# Patient Record
Sex: Female | Born: 1960 | Race: White | Hispanic: No | Smoking: Never smoker
Health system: Southern US, Community
[De-identification: ages and names within clinical notes are randomized; demographics above are authoritative.]

---

## 1999-01-29 ENCOUNTER — Other Ambulatory Visit: Admission: RE | Admit: 1999-01-29 | Discharge: 1999-01-29 | Payer: Self-pay | Admitting: Obstetrics and Gynecology

## 1999-02-22 ENCOUNTER — Other Ambulatory Visit: Admission: RE | Admit: 1999-02-22 | Discharge: 1999-02-22 | Payer: Self-pay | Admitting: Obstetrics and Gynecology

## 2000-01-31 ENCOUNTER — Other Ambulatory Visit: Admission: RE | Admit: 2000-01-31 | Discharge: 2000-01-31 | Payer: Self-pay | Admitting: Obstetrics and Gynecology

## 2004-01-24 ENCOUNTER — Other Ambulatory Visit: Admission: RE | Admit: 2004-01-24 | Discharge: 2004-01-24 | Payer: Self-pay | Admitting: Obstetrics and Gynecology

## 2004-01-24 ENCOUNTER — Encounter: Admission: RE | Admit: 2004-01-24 | Discharge: 2004-01-24 | Payer: Self-pay | Admitting: Obstetrics and Gynecology

## 2005-01-28 ENCOUNTER — Other Ambulatory Visit: Admission: RE | Admit: 2005-01-28 | Discharge: 2005-01-28 | Payer: Self-pay | Admitting: Obstetrics and Gynecology

## 2006-01-06 ENCOUNTER — Other Ambulatory Visit: Admission: RE | Admit: 2006-01-06 | Discharge: 2006-01-06 | Payer: Self-pay | Admitting: Family Medicine

## 2007-02-02 ENCOUNTER — Other Ambulatory Visit: Admission: RE | Admit: 2007-02-02 | Discharge: 2007-02-02 | Payer: Self-pay | Admitting: Family Medicine

## 2010-12-01 HISTORY — PX: BREAST BIOPSY: SHX20

## 2011-08-12 ENCOUNTER — Other Ambulatory Visit: Payer: Self-pay | Admitting: Obstetrics and Gynecology

## 2011-08-12 ENCOUNTER — Other Ambulatory Visit (HOSPITAL_COMMUNITY)
Admission: RE | Admit: 2011-08-12 | Discharge: 2011-08-12 | Disposition: A | Discharge status: Home / Self Care | Payer: PRIVATE HEALTH INSURANCE | Source: Ambulatory Visit | Attending: Obstetrics and Gynecology | Admitting: Obstetrics and Gynecology

## 2011-08-12 DIAGNOSIS — Z01419 Encounter for gynecological examination (general) (routine) without abnormal findings: Secondary | ICD-10-CM | POA: Insufficient documentation

## 2011-08-12 DIAGNOSIS — Z1231 Encounter for screening mammogram for malignant neoplasm of breast: Secondary | ICD-10-CM

## 2011-08-14 ENCOUNTER — Ambulatory Visit
Admission: RE | Admit: 2011-08-14 | Discharge: 2011-08-14 | Disposition: A | Discharge status: Home / Self Care | Payer: PRIVATE HEALTH INSURANCE | Source: Ambulatory Visit | Attending: Obstetrics and Gynecology | Admitting: Obstetrics and Gynecology

## 2011-08-14 DIAGNOSIS — Z1231 Encounter for screening mammogram for malignant neoplasm of breast: Secondary | ICD-10-CM

## 2011-08-20 ENCOUNTER — Other Ambulatory Visit: Payer: Self-pay | Admitting: Obstetrics and Gynecology

## 2011-08-20 DIAGNOSIS — R928 Other abnormal and inconclusive findings on diagnostic imaging of breast: Secondary | ICD-10-CM

## 2011-08-28 ENCOUNTER — Other Ambulatory Visit: Payer: Self-pay | Admitting: Obstetrics and Gynecology

## 2011-08-28 ENCOUNTER — Ambulatory Visit
Admission: RE | Admit: 2011-08-28 | Discharge: 2011-08-28 | Disposition: A | Discharge status: Home / Self Care | Payer: PRIVATE HEALTH INSURANCE | Source: Ambulatory Visit | Attending: Obstetrics and Gynecology | Admitting: Obstetrics and Gynecology

## 2011-08-28 DIAGNOSIS — R928 Other abnormal and inconclusive findings on diagnostic imaging of breast: Secondary | ICD-10-CM

## 2011-09-02 ENCOUNTER — Ambulatory Visit
Admission: RE | Admit: 2011-09-02 | Discharge: 2011-09-02 | Disposition: A | Discharge status: Home / Self Care | Payer: PRIVATE HEALTH INSURANCE | Source: Ambulatory Visit | Attending: Obstetrics and Gynecology | Admitting: Obstetrics and Gynecology

## 2011-09-02 ENCOUNTER — Other Ambulatory Visit: Payer: Self-pay | Admitting: Obstetrics and Gynecology

## 2011-09-02 DIAGNOSIS — R928 Other abnormal and inconclusive findings on diagnostic imaging of breast: Secondary | ICD-10-CM

## 2011-09-02 DIAGNOSIS — R921 Mammographic calcification found on diagnostic imaging of breast: Secondary | ICD-10-CM

## 2011-09-04 ENCOUNTER — Ambulatory Visit
Admission: RE | Admit: 2011-09-04 | Discharge: 2011-09-04 | Disposition: A | Discharge status: Home / Self Care | Payer: PRIVATE HEALTH INSURANCE | Source: Ambulatory Visit | Attending: Obstetrics and Gynecology | Admitting: Obstetrics and Gynecology

## 2011-09-04 DIAGNOSIS — R921 Mammographic calcification found on diagnostic imaging of breast: Secondary | ICD-10-CM

## 2012-04-04 IMAGING — MG MM BREAST STEREO BIOPSY*L*
3 series · 3 of 3 positions shown · non-contrast
Comparison: none

CLINICAL DATA: Left breast calcifications

[L CC (1 of 2)]
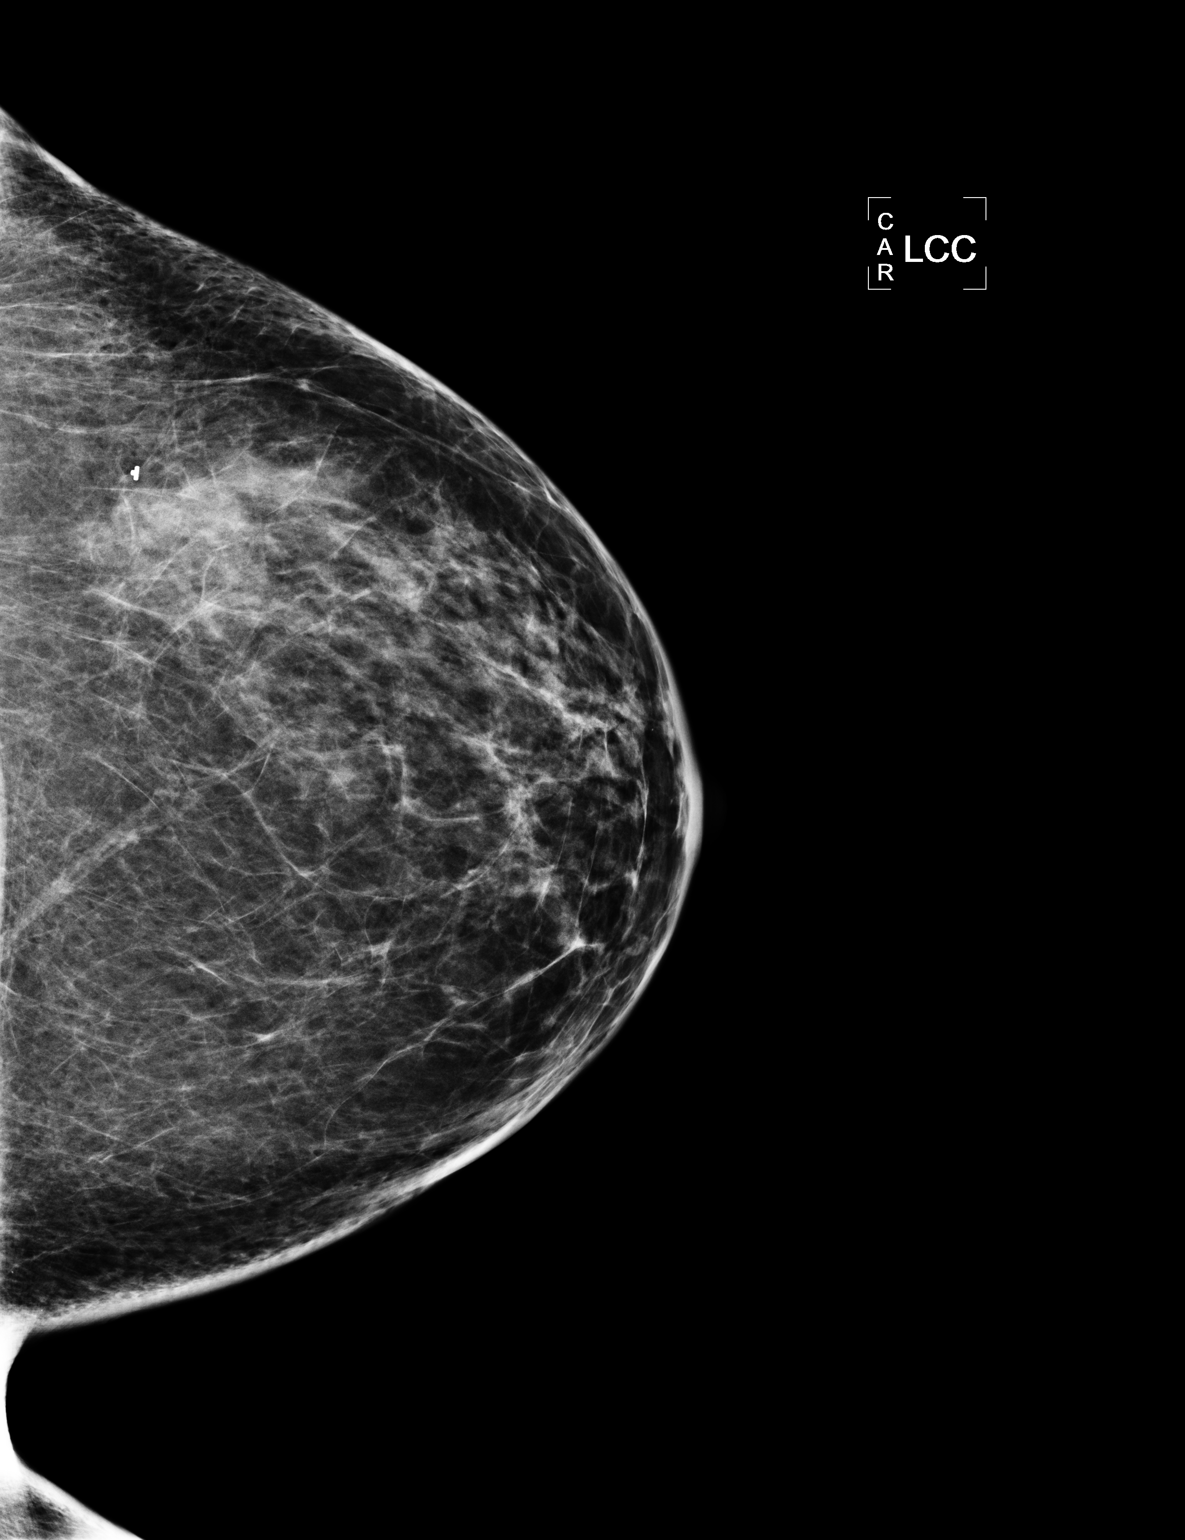

[L CC (2 of 2)]
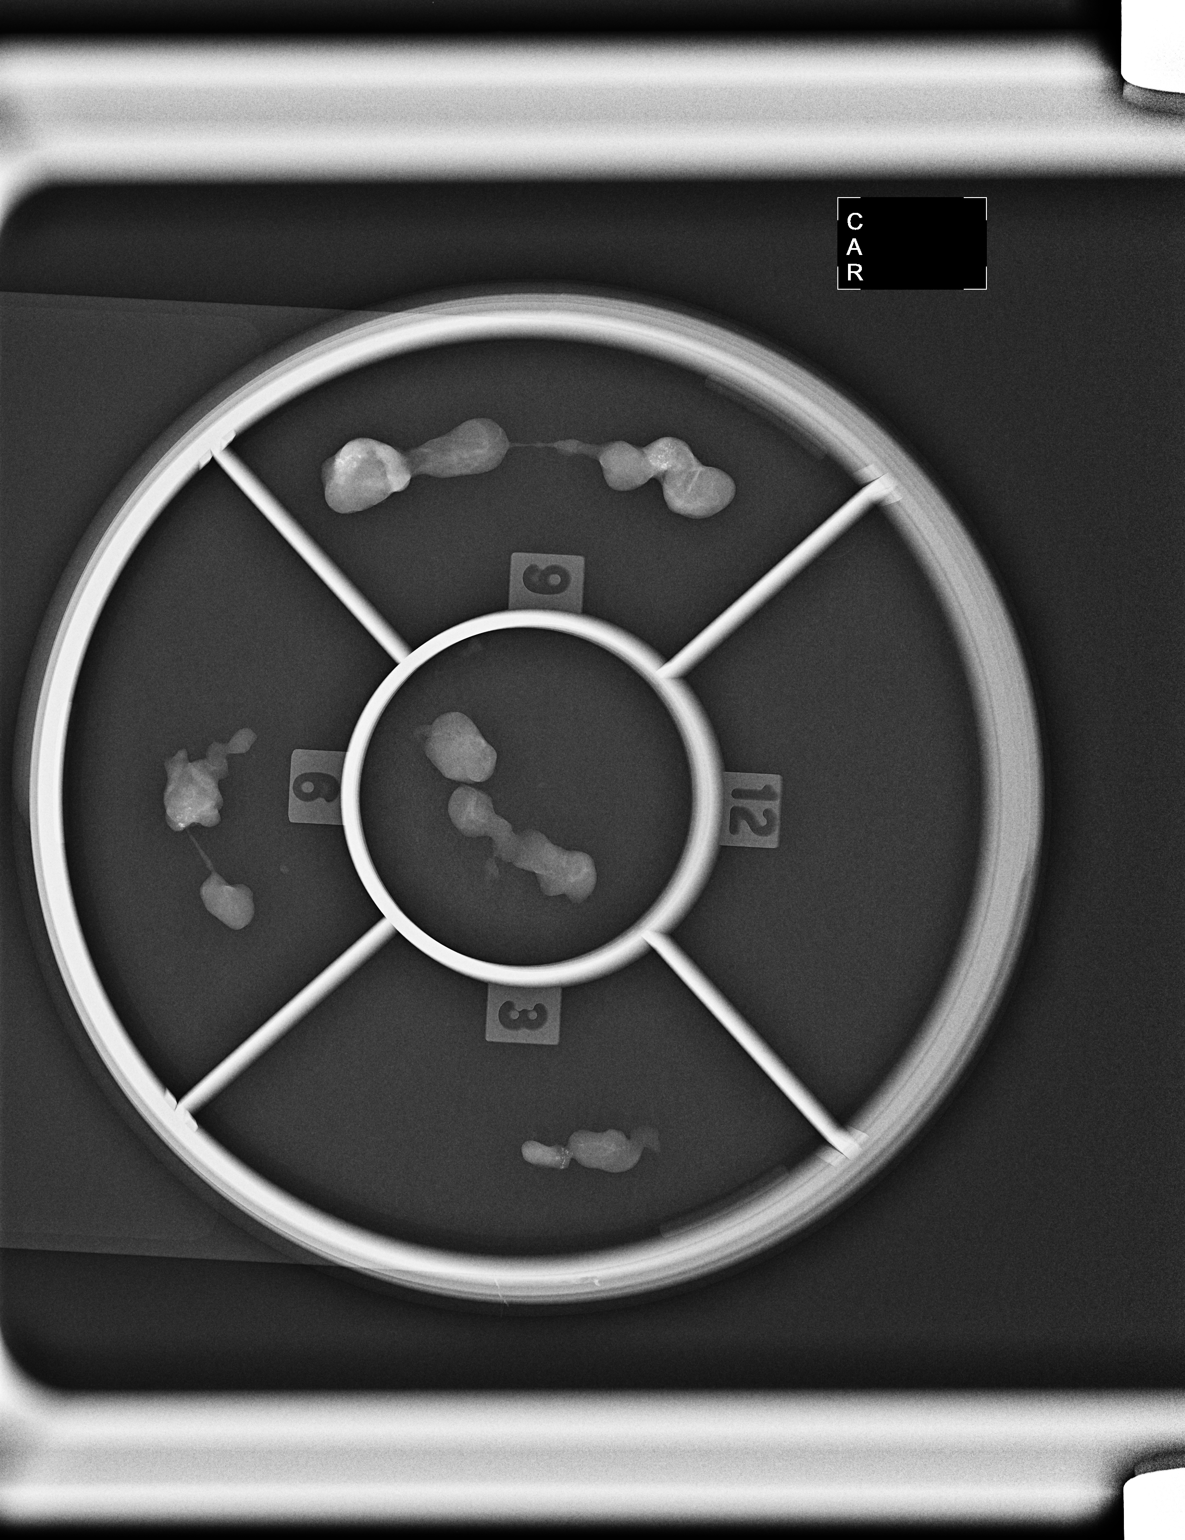

[L ML]
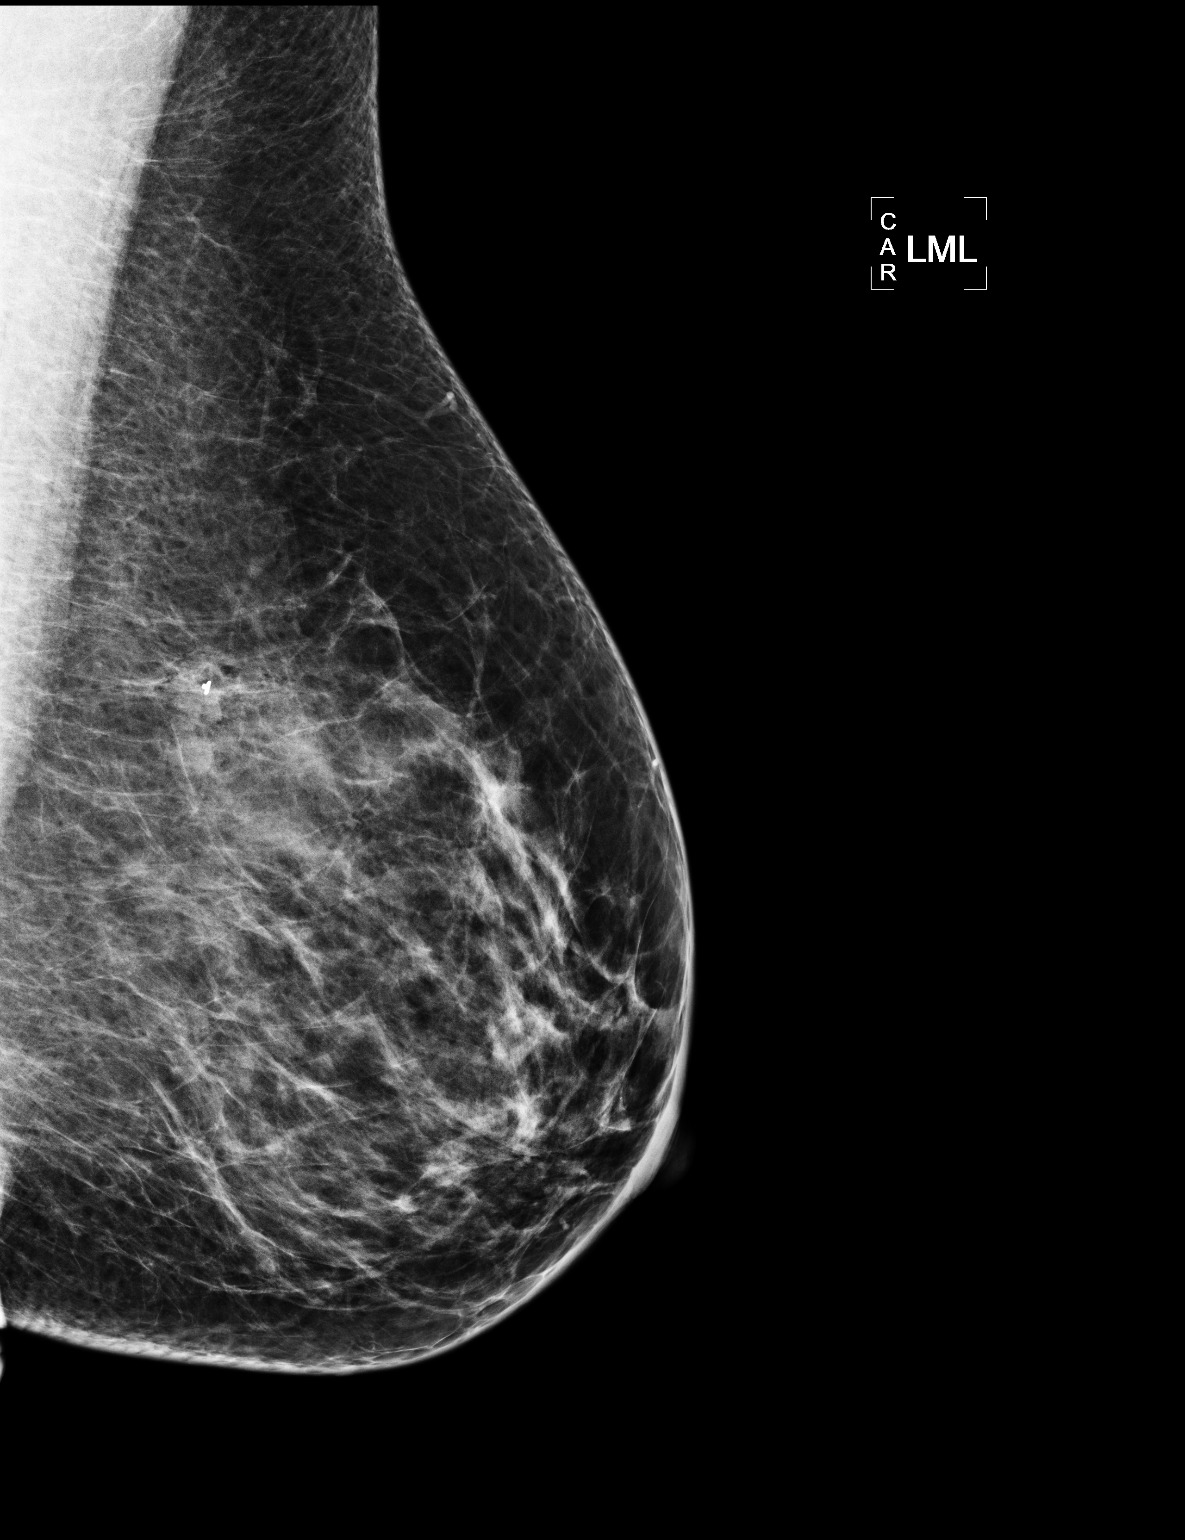

[3 of 3 positions shown; findings below may reference images not displayed]

STEREOTACTIC-GUIDED VACUUM ASSISTED BIOPSY OF THE LEFT BREAST AND
SPECIMEN RADIOGRAPH

I met with the patient, and we discussed the procedure of
stereotactic-guided biopsy, including risks, benefits, and
alternatives.  Specifically, we discussed the risks of infection,
bleeding, tissue injury, clip migration, and inadequate sampling.
Informed, written consent was given.

Using sterile technique, 2% lidocaine, stereotactic guidance, and a
9 gauge vacuum assisted device, biopsy was performed of the
calcifications.  Specimen radiograph was performed, showing
adequate calcifications present in the sample.  Specimens with
calcifications are identified for pathology.

At the conclusion of the procedure, a tissue marker clip was
deployed into the biopsy cavity.  Follow-up 2-view mammogram
confirmed clip placement in the upper-outer quadrant of the left
breast, posteriorly.  Minimal residual calcifications are
identified0.
IMPRESSION: Stereotactic-guided biopsy of calcifications, left breast.  No
apparent complications.

## 2014-10-09 ENCOUNTER — Other Ambulatory Visit: Payer: Self-pay | Admitting: Adult Health Nurse Practitioner

## 2014-10-09 DIAGNOSIS — Z1231 Encounter for screening mammogram for malignant neoplasm of breast: Secondary | ICD-10-CM

## 2014-10-12 ENCOUNTER — Encounter (INDEPENDENT_AMBULATORY_CARE_PROVIDER_SITE_OTHER): Payer: Self-pay | Admitting: *Deleted

## 2014-11-01 ENCOUNTER — Ambulatory Visit
Admission: RE | Admit: 2014-11-01 | Discharge: 2014-11-01 | Disposition: A | Discharge status: Home / Self Care | Payer: 59 | Source: Ambulatory Visit | Attending: Adult Health Nurse Practitioner | Admitting: Adult Health Nurse Practitioner

## 2014-11-01 ENCOUNTER — Encounter (INDEPENDENT_AMBULATORY_CARE_PROVIDER_SITE_OTHER): Payer: Self-pay

## 2014-11-01 DIAGNOSIS — Z1231 Encounter for screening mammogram for malignant neoplasm of breast: Secondary | ICD-10-CM

## 2014-11-02 ENCOUNTER — Encounter (INDEPENDENT_AMBULATORY_CARE_PROVIDER_SITE_OTHER): Payer: Self-pay | Admitting: *Deleted

## 2015-11-01 ENCOUNTER — Other Ambulatory Visit: Payer: Self-pay | Admitting: Adult Health Nurse Practitioner

## 2015-11-01 DIAGNOSIS — Z1231 Encounter for screening mammogram for malignant neoplasm of breast: Secondary | ICD-10-CM

## 2015-11-06 ENCOUNTER — Ambulatory Visit
Admission: RE | Admit: 2015-11-06 | Discharge: 2015-11-06 | Disposition: A | Discharge status: Home / Self Care | Payer: 59 | Source: Ambulatory Visit | Attending: Adult Health Nurse Practitioner | Admitting: Adult Health Nurse Practitioner

## 2015-11-06 DIAGNOSIS — Z1231 Encounter for screening mammogram for malignant neoplasm of breast: Secondary | ICD-10-CM

## 2015-11-08 ENCOUNTER — Ambulatory Visit: Payer: 59

## 2016-01-02 ENCOUNTER — Encounter: Payer: 59 | Admitting: Internal Medicine

## 2016-02-05 ENCOUNTER — Encounter: Payer: Self-pay | Admitting: Adult Health Nurse Practitioner

## 2017-08-31 ENCOUNTER — Other Ambulatory Visit: Payer: Self-pay | Admitting: Family Medicine

## 2017-08-31 DIAGNOSIS — Z1231 Encounter for screening mammogram for malignant neoplasm of breast: Secondary | ICD-10-CM

## 2017-09-09 ENCOUNTER — Ambulatory Visit
Admission: RE | Admit: 2017-09-09 | Discharge: 2017-09-09 | Disposition: A | Discharge status: Home / Self Care | Payer: BLUE CROSS/BLUE SHIELD | Source: Ambulatory Visit | Attending: Family Medicine | Admitting: Family Medicine

## 2017-09-09 DIAGNOSIS — Z1231 Encounter for screening mammogram for malignant neoplasm of breast: Secondary | ICD-10-CM

## 2017-10-06 ENCOUNTER — Encounter: Payer: Self-pay | Admitting: Adult Health Nurse Practitioner

## 2017-12-31 ENCOUNTER — Emergency Department (HOSPITAL_COMMUNITY): Payer: Worker's Compensation

## 2017-12-31 ENCOUNTER — Other Ambulatory Visit: Payer: Self-pay

## 2017-12-31 ENCOUNTER — Encounter (HOSPITAL_COMMUNITY): Payer: Self-pay | Admitting: *Deleted

## 2017-12-31 ENCOUNTER — Emergency Department (HOSPITAL_COMMUNITY)
Admission: EM | Admit: 2017-12-31 | Discharge: 2017-12-31 | Disposition: A | Discharge status: Home / Self Care | Payer: Worker's Compensation | Attending: Emergency Medicine | Admitting: Emergency Medicine

## 2017-12-31 DIAGNOSIS — Y9289 Other specified places as the place of occurrence of the external cause: Secondary | ICD-10-CM | POA: Insufficient documentation

## 2017-12-31 DIAGNOSIS — W01118A Fall on same level from slipping, tripping and stumbling with subsequent striking against other sharp object, initial encounter: Secondary | ICD-10-CM | POA: Insufficient documentation

## 2017-12-31 DIAGNOSIS — S60552A Superficial foreign body of left hand, initial encounter: Secondary | ICD-10-CM

## 2017-12-31 DIAGNOSIS — Y990 Civilian activity done for income or pay: Secondary | ICD-10-CM | POA: Diagnosis not present

## 2017-12-31 DIAGNOSIS — S6992XA Unspecified injury of left wrist, hand and finger(s), initial encounter: Secondary | ICD-10-CM | POA: Diagnosis present

## 2017-12-31 DIAGNOSIS — Y9301 Activity, walking, marching and hiking: Secondary | ICD-10-CM | POA: Insufficient documentation

## 2017-12-31 DIAGNOSIS — Z23 Encounter for immunization: Secondary | ICD-10-CM | POA: Diagnosis not present

## 2017-12-31 MED ORDER — TETANUS-DIPHTH-ACELL PERTUSSIS 5-2.5-18.5 LF-MCG/0.5 IM SUSP
0.5000 mL | Freq: Once | INTRAMUSCULAR | Status: AC
  Administered 2017-12-31: 0.5 mL via INTRAMUSCULAR
  Filled 2017-12-31: qty 0.5

## 2017-12-31 MED ORDER — LIDOCAINE HCL (PF) 1 % IJ SOLN
INTRAMUSCULAR | Status: AC
  Filled 2017-12-31: qty 5

## 2017-12-31 MED ORDER — LIDOCAINE HCL (PF) 1 % IJ SOLN
2.0000 mL | Freq: Once | INTRAMUSCULAR | Status: AC
  Administered 2017-12-31: 2 mL

## 2017-12-31 NOTE — ED Notes (Signed)
Wound care provided; abx ointment applied and covered with Janese BanksKerlex

## 2017-12-31 NOTE — ED Triage Notes (Addendum)
Pt was working, fell, injuring L hand. Splinter noted to be pentrating to L hand. Reports last tetanus >5 years, and is a workman's comp injury

## 2017-12-31 NOTE — ED Notes (Signed)
ED Provider at bedside. 

## 2017-12-31 NOTE — ED Provider Notes (Signed)
[facility]   CSN: [id number] Arrival date & time: 12/31/17  1952     History   Chief Complaint No chief complaint on file.   HPI Deanna Novak is a 57 y.o. female.  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 Protruding splinter noted to palmer surface of left hand at base of thumb. Minimal bleeding.    The history is provided by the patient. No language interpreter was used.  Hand Injury   The incident occurred 1 to 2 hours ago. The incident occurred at work. The injury mechanism was a fall. The pain is present in the right hand. The quality of the pain is described as throbbing. The pain is moderate. The pain has been constant since the incident. Possible foreign bodies include wood.    History reviewed. No pertinent past medical history.  There are no active problems to display for this patient.   History reviewed. No pertinent surgical history.  OB History    No data available       Home Medications    Prior to Admission medications   Not on File    Family History Family History  Problem Relation Age of Onset  . Breast cancer Neg Hx     Social History Social History   Tobacco Use  . Smoking status: Never Smoker  . Smokeless tobacco: Never Used  Substance Use Topics  . Alcohol use: Yes    Frequency: Never  . Drug use: No     Allergies   Patient has no known allergies.   Review of Systems Review of Systems  Skin: Positive for wound.  All other systems reviewed and are negative.    Physical Exam Updated Vital Signs BP (!) 150/88   Pulse 68   Temp 97.6 F (36.4 C)   Resp 16   SpO2 100%   Physical Exam  Constitutional: She is oriented to person, place, and time. She appears well-developed and well-nourished.  Eyes: Conjunctivae are normal.  Neck: Neck supple.  Cardiovascular: Normal rate and regular rhythm.  Pulmonary/Chest:  Effort normal and breath sounds normal.  Abdominal: Soft. Bowel sounds are normal.  Musculoskeletal: She exhibits tenderness.       Hands: Approximately 4 cm wooden splinter embedded in the soft tissue at the base of the left thumb in a vertical orientation from the lateral aspect and pointing to the middle finger. Entrance and exit wounds are approximately 0.3 cm apart.  Neurological: She is alert and oriented to person, place, and time.  Skin: Skin is warm and dry.  Psychiatric: She has a normal mood and affect.  Nursing note and vitals reviewed.    ED Treatments / Results  Labs (all labs ordered are listed, but only abnormal results are displayed) Labs Reviewed - No data to display  EKG  EKG Interpretation None       Radiology No results found.  Procedures .Foreign Body Removal Date/Time: 12/31/2017 9:15 PM Performed by: Felicie MornSmith, Mykia Holton, NP Authorized by: Felicie MornSmith, Annison Birchard, NP  Consent: Verbal consent obtained. Risks and benefits: risks, benefits and alternatives were discussed Consent given by: patient Patient understanding: patient states understanding of the procedure being performed Imaging studies: imaging studies available Patient identity confirmed: verbally with patient and arm band Time out: Immediately prior to procedure a "time out" was called to verify the correct patient, procedure, equipment, support staff and site/side marked as required. Body area: skin  General location: upper extremity Location details: left hand Anesthesia: local infiltration  Anesthesia: Local Anesthetic: lidocaine 1% without epinephrine Anesthetic total: 2 mL Localization method: visualized Removal mechanism: hemostat and irrigation Dressing: antibiotic ointment and dressing applied Tendon involvement: none Depth: subcutaneous Complexity: simple 1 objects recovered. Objects recovered: wooden splinter Post-procedure assessment: foreign body removed Patient tolerance: Patient  tolerated the procedure well with no immediate complications Comments: Foreign body removed from palmer surface of left hand at base of thumb. Cavity Irrigated copiously with normal saline.    (including critical care time)  Medications Ordered in ED Medications  Tdap (BOOSTRIX) injection 0.5 mL (0.5 mLs Intramuscular Given 12/31/17 2022)  lidocaine (PF) (XYLOCAINE) 1 % injection 2 mL (2 mLs Infiltration Given by Other 12/31/17 2021)     Initial Impression / Assessment and Plan / ED Course  I have reviewed the triage vital signs and the nursing notes.  Pertinent labs & imaging results that were available during my care of the patient were reviewed by me and considered in my medical decision making (see chart for details).     Embedded foreign body (wooden splinter) in the soft tissue of the palmer surface of the left hand at the base of the thumb. Foreign body removed, wound track irrigated copiously. Patient counseled to monitor for any signs of infection. Care instructions and return precautions discussed. Patient appears safe for discharge at this time.  Final Clinical Impressions(s) / ED Diagnoses   Final diagnoses:  Foreign body of left hand, initial encounter    ED Discharge Orders    None       Felicie Morn, NP 01/01/18 0011    Tegeler, Canary Brim, MD 01/01/18 [phone]

## 2018-09-07 ENCOUNTER — Other Ambulatory Visit: Payer: Self-pay | Admitting: Family Medicine

## 2018-09-07 DIAGNOSIS — Z1231 Encounter for screening mammogram for malignant neoplasm of breast: Secondary | ICD-10-CM

## 2018-10-06 ENCOUNTER — Ambulatory Visit: Payer: Self-pay

## 2018-11-03 ENCOUNTER — Ambulatory Visit
Admission: RE | Admit: 2018-11-03 | Discharge: 2018-11-03 | Disposition: A | Discharge status: Home / Self Care | Payer: BLUE CROSS/BLUE SHIELD | Source: Ambulatory Visit | Attending: Family Medicine | Admitting: Family Medicine

## 2018-11-03 DIAGNOSIS — Z1231 Encounter for screening mammogram for malignant neoplasm of breast: Secondary | ICD-10-CM

## 2019-06-06 IMAGING — MG DIGITAL SCREENING BILATERAL MAMMOGRAM WITH CAD
4 series · 4 of 4 positions shown · non-contrast
Comparison: Previous exam(s).

CLINICAL DATA: Screening.

EXAM:
DIGITAL SCREENING BILATERAL MAMMOGRAM WITH CAD

[R CC]
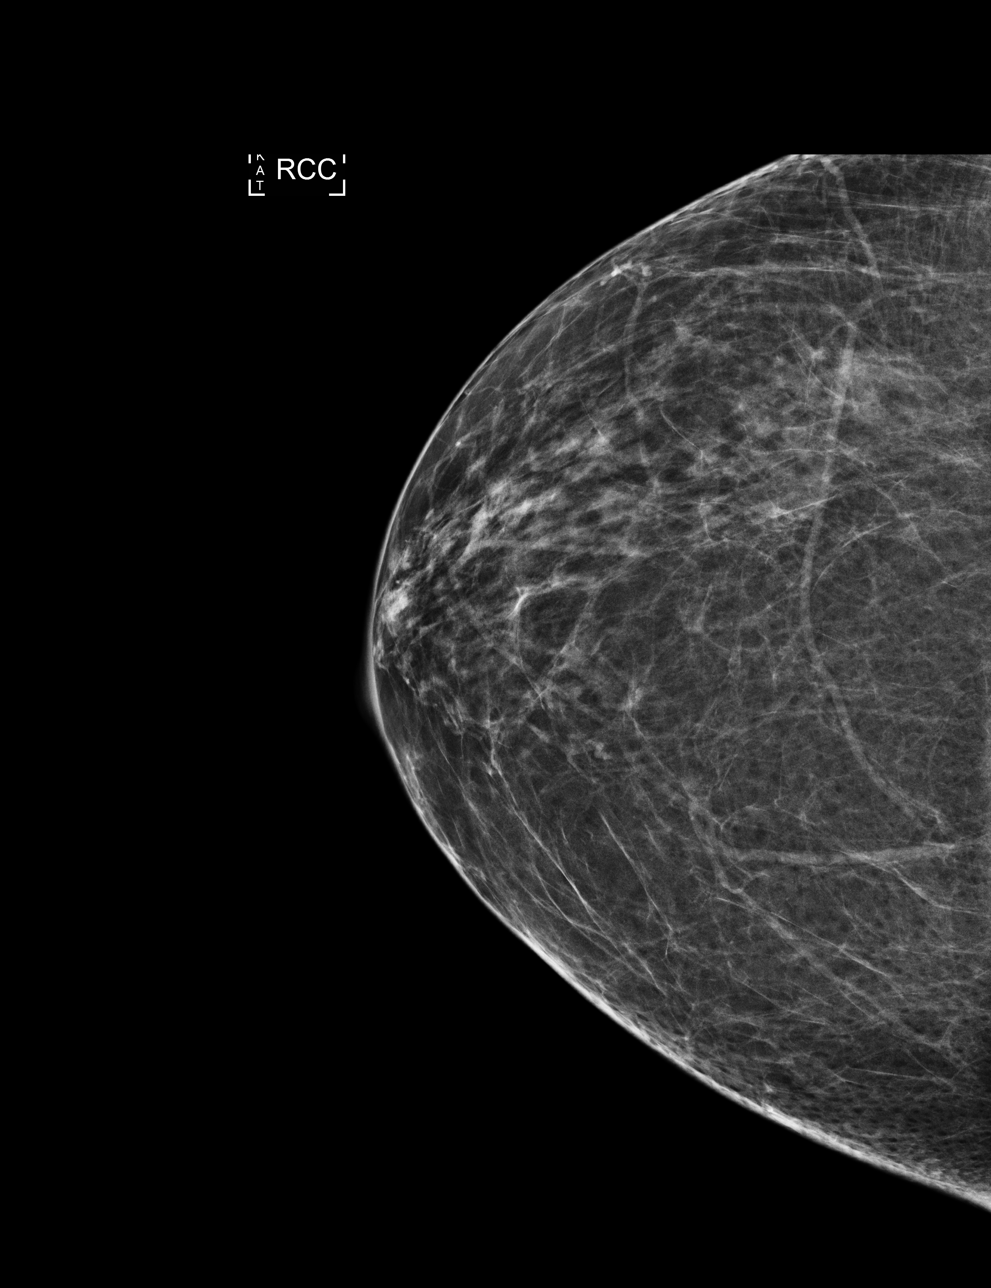

[R MLO]
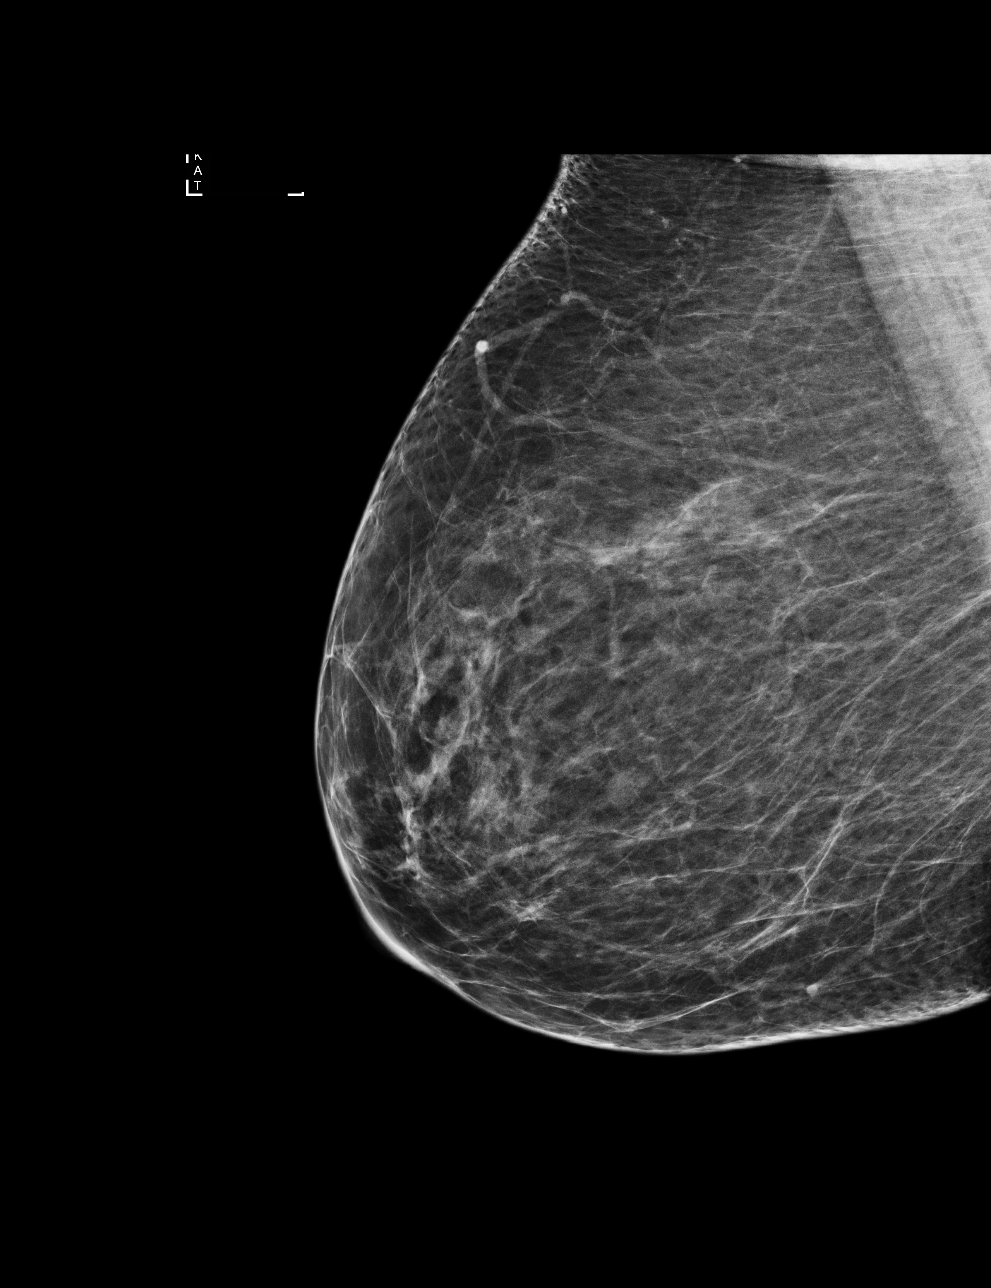

[L CC]
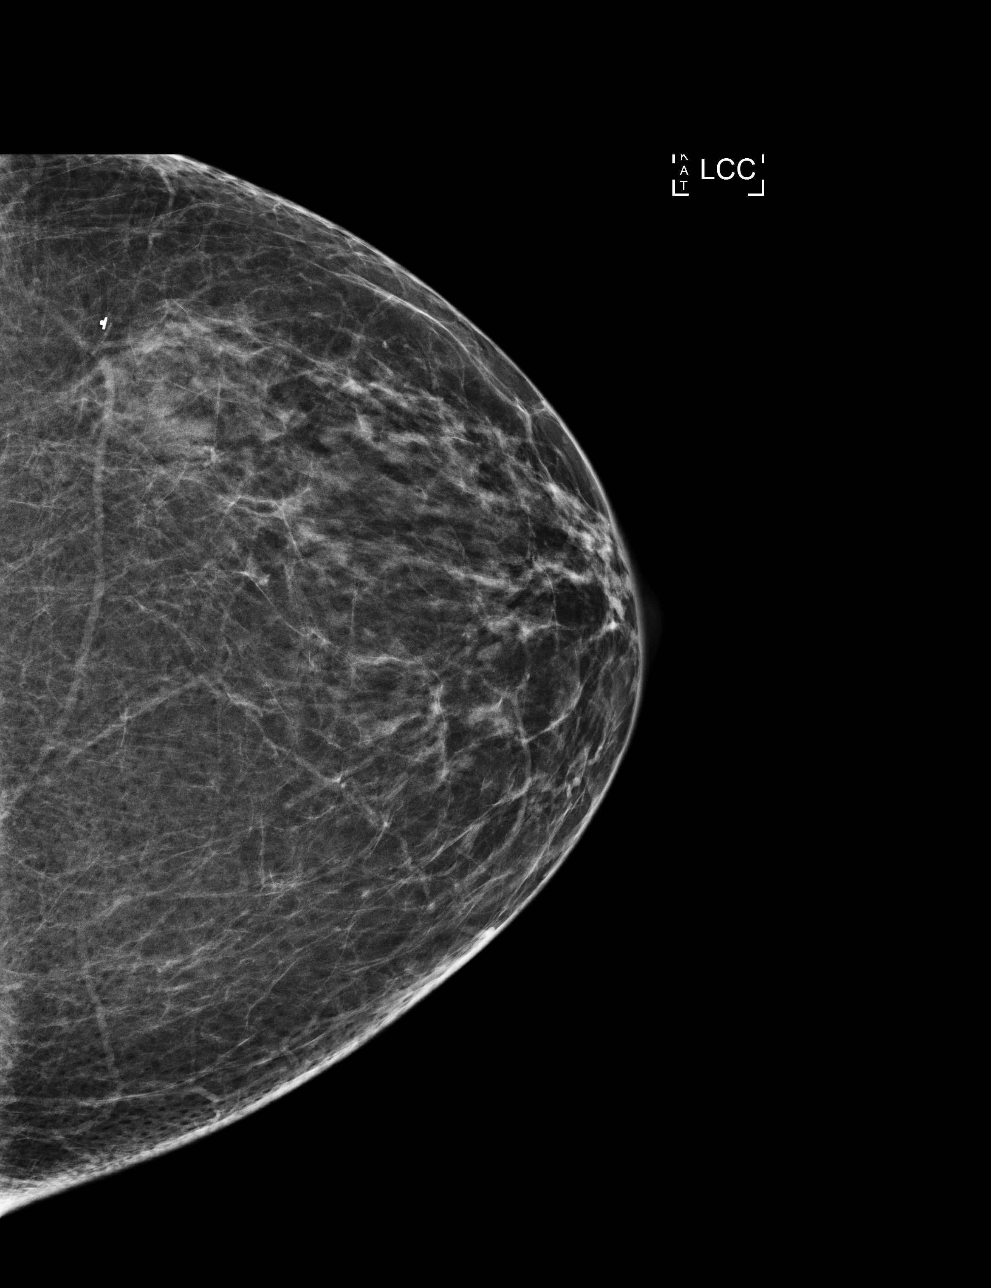

[L MLO]
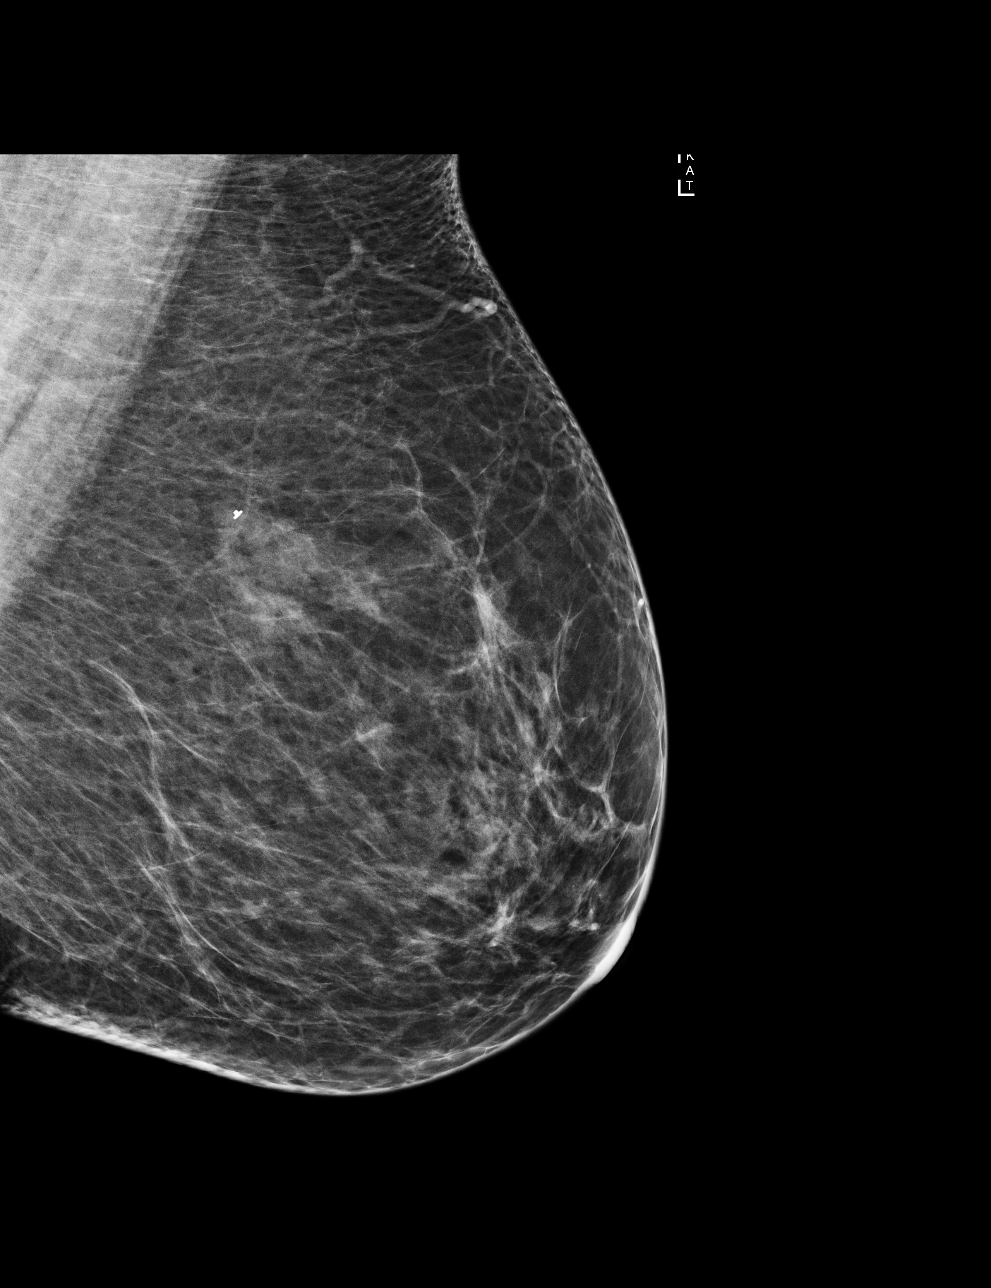

[4 of 4 positions shown; findings below may reference images not displayed]

ACR Breast Density Category b: There are scattered areas of
fibroglandular density.
FINDINGS: There are no findings suspicious for malignancy. Images were
processed with CAD.
IMPRESSION: No mammographic evidence of malignancy. A result letter of this
screening mammogram will be mailed directly to the patient.

RECOMMENDATION:
Screening mammogram in one year. (Code:AS-G-LCT)

BI-RADS CATEGORY  1: Negative.

## 2023-04-15 DIAGNOSIS — Z6826 Body mass index (BMI) 26.0-26.9, adult: Secondary | ICD-10-CM | POA: Diagnosis not present

## 2023-04-15 DIAGNOSIS — B001 Herpesviral vesicular dermatitis: Secondary | ICD-10-CM | POA: Diagnosis not present

## 2023-10-07 DIAGNOSIS — L821 Other seborrheic keratosis: Secondary | ICD-10-CM | POA: Diagnosis not present

## 2023-10-07 DIAGNOSIS — L814 Other melanin hyperpigmentation: Secondary | ICD-10-CM | POA: Diagnosis not present

## 2023-10-07 DIAGNOSIS — D485 Neoplasm of uncertain behavior of skin: Secondary | ICD-10-CM | POA: Diagnosis not present

## 2023-10-07 DIAGNOSIS — Z7189 Other specified counseling: Secondary | ICD-10-CM | POA: Diagnosis not present

## 2023-10-07 DIAGNOSIS — B351 Tinea unguium: Secondary | ICD-10-CM | POA: Diagnosis not present

## 2023-10-07 DIAGNOSIS — D225 Melanocytic nevi of trunk: Secondary | ICD-10-CM | POA: Diagnosis not present
